# Patient Record
Sex: Female | Born: 1980 | Hispanic: No | Marital: Married | Smoking: Never smoker
Health system: Southern US, Community
[De-identification: ages and names within clinical notes are randomized; demographics above are authoritative.]

---

## 2008-12-10 ENCOUNTER — Ambulatory Visit: Payer: Self-pay | Admitting: Gynecology

## 2008-12-10 ENCOUNTER — Encounter: Payer: Self-pay | Admitting: Gynecology

## 2008-12-10 ENCOUNTER — Other Ambulatory Visit: Admission: RE | Admit: 2008-12-10 | Discharge: 2008-12-10 | Payer: Self-pay | Admitting: Gynecology

## 2008-12-13 ENCOUNTER — Encounter: Payer: Self-pay | Admitting: Gynecology

## 2008-12-13 ENCOUNTER — Ambulatory Visit: Payer: Self-pay | Admitting: Gynecology

## 2008-12-28 ENCOUNTER — Ambulatory Visit: Payer: Self-pay | Admitting: Gynecology

## 2015-05-11 ENCOUNTER — Other Ambulatory Visit: Payer: Self-pay | Admitting: Internal Medicine

## 2015-05-11 DIAGNOSIS — E041 Nontoxic single thyroid nodule: Secondary | ICD-10-CM

## 2015-05-25 ENCOUNTER — Other Ambulatory Visit: Payer: Self-pay | Admitting: Internal Medicine

## 2015-05-25 ENCOUNTER — Inpatient Hospital Stay
Admission: RE | Admit: 2015-05-25 | Discharge: 2015-05-25 | Disposition: A | Discharge status: Home / Self Care | Payer: Self-pay | Source: Ambulatory Visit | Attending: Internal Medicine | Admitting: Internal Medicine

## 2015-05-25 DIAGNOSIS — E041 Nontoxic single thyroid nodule: Secondary | ICD-10-CM

## 2016-10-12 ENCOUNTER — Encounter (HOSPITAL_COMMUNITY): Payer: Self-pay

## 2016-10-12 ENCOUNTER — Observation Stay (HOSPITAL_COMMUNITY)
Admission: EM | Admit: 2016-10-12 | Discharge: 2016-10-13 | Disposition: A | Discharge status: Home / Self Care | Payer: 59 | Attending: Internal Medicine | Admitting: Internal Medicine

## 2016-10-12 DIAGNOSIS — D649 Anemia, unspecified: Secondary | ICD-10-CM | POA: Diagnosis not present

## 2016-10-12 DIAGNOSIS — N920 Excessive and frequent menstruation with regular cycle: Secondary | ICD-10-CM | POA: Diagnosis not present

## 2016-10-12 DIAGNOSIS — R55 Syncope and collapse: Secondary | ICD-10-CM

## 2016-10-12 DIAGNOSIS — Z91018 Allergy to other foods: Secondary | ICD-10-CM | POA: Diagnosis not present

## 2016-10-12 DIAGNOSIS — D509 Iron deficiency anemia, unspecified: Secondary | ICD-10-CM | POA: Diagnosis not present

## 2016-10-12 LAB — BASIC METABOLIC PANEL WITH GFR
Anion gap: 9 (ref 5–15)
BUN: 15 mg/dL (ref 6–20)
CO2: 19 mmol/L — ABNORMAL LOW (ref 22–32)
Calcium: 8.4 mg/dL — ABNORMAL LOW (ref 8.9–10.3)
Chloride: 110 mmol/L (ref 101–111)
Creatinine, Ser: 0.91 mg/dL (ref 0.44–1.00)
GFR calc Af Amer: >60 mL/min (ref >60)
GFR calc non Af Amer: >60 mL/min (ref >60)
Glucose, Bld: 120 mg/dL — ABNORMAL HIGH (ref 65–99)
Potassium: 3.7 mmol/L (ref 3.5–5.1)
Sodium: 138 mmol/L (ref 135–145)

## 2016-10-12 LAB — CBC
HCT: 22.6 % — ABNORMAL LOW (ref 36.0–46.0)
Hemoglobin: 7.1 g/dL — ABNORMAL LOW (ref 12.0–15.0)
MCH: 17.8 pg — ABNORMAL LOW (ref 26.0–34.0)
MCHC: 31.4 g/dL (ref 30.0–36.0)
MCV: 56.5 fL — ABNORMAL LOW (ref 78.0–100.0)
Platelets: 174 K/uL (ref 150–400)
RBC: 4 MIL/uL (ref 3.87–5.11)
RDW: 19.3 % — ABNORMAL HIGH (ref 11.5–15.5)
WBC: 10.1 K/uL (ref 4.0–10.5)

## 2016-10-12 LAB — PREPARE RBC (CROSSMATCH)

## 2016-10-12 LAB — URINALYSIS, ROUTINE W REFLEX MICROSCOPIC
Bilirubin Urine: NEGATIVE
Glucose, UA: NEGATIVE mg/dL
Hgb urine dipstick: NEGATIVE
KETONES UR: 20 mg/dL — AB
LEUKOCYTES UA: NEGATIVE
NITRITE: NEGATIVE
PH: 5 (ref 5.0–8.0)
Protein, ur: NEGATIVE mg/dL
Specific Gravity, Urine: 1.016 (ref 1.005–1.030)

## 2016-10-12 LAB — POC OCCULT BLOOD, ED: FECAL OCCULT BLD: NEGATIVE

## 2016-10-12 LAB — CBG MONITORING, ED: Glucose-Capillary: 117 mg/dL — ABNORMAL HIGH (ref 65–99)

## 2016-10-12 MED ORDER — SODIUM CHLORIDE 0.9% FLUSH
3.0000 mL | Freq: Two times a day (BID) | INTRAVENOUS | Status: DC
  Administered 2016-10-13: 3 mL via INTRAVENOUS

## 2016-10-12 MED ORDER — SODIUM CHLORIDE 0.9% FLUSH: 3.0000 mL | INTRAVENOUS | Status: DC | PRN

## 2016-10-12 MED ORDER — SODIUM CHLORIDE 0.9 % IV BOLUS (SEPSIS)
1000.0000 mL | Freq: Once | INTRAVENOUS | Status: AC
  Administered 2016-10-12: 1000 mL via INTRAVENOUS

## 2016-10-12 MED ORDER — ONDANSETRON HCL 4 MG PO TABS: 4.0000 mg | ORAL_TABLET | Freq: Four times a day (QID) | ORAL | Status: DC | PRN

## 2016-10-12 MED ORDER — SODIUM CHLORIDE 0.9 % IV SOLN: 10.0000 mL/h | Freq: Once | INTRAVENOUS | Status: DC

## 2016-10-12 MED ORDER — ONDANSETRON HCL 4 MG/2ML IJ SOLN: 4.0000 mg | Freq: Four times a day (QID) | INTRAMUSCULAR | Status: DC | PRN

## 2016-10-12 MED ORDER — SODIUM CHLORIDE 0.9 % IV SOLN: 250.0000 mL | INTRAVENOUS | Status: DC | PRN

## 2016-10-12 MED ORDER — SENNOSIDES-DOCUSATE SODIUM 8.6-50 MG PO TABS: 1.0000 | ORAL_TABLET | Freq: Every evening | ORAL | Status: DC | PRN

## 2016-10-12 NOTE — ED Triage Notes (Signed)
Pt brought in By EMS with an episode of near syncope as per EMS pt was working out, pt had some light-headness. Per EMS 60/40. Pt was given 1 bolus Normal saline, and per EMS had episode of nausea and was given 4 mg of Zofran.

## 2016-10-12 NOTE — ED Provider Notes (Signed)
Emergency Department Provider Note   I have reviewed the triage vital signs and the nursing notes.   HISTORY  Chief Complaint Near Syncope and Dehydration   HPI Elizabeth Spears is a 36 y.o. female with PMH of iron deficiency anemia presents to the emergency department for evaluation of syncope after an exercise class. Patient states she was fine during the class but afterwards had a syncopal event. She denies any preceding chest pain, palpitations, difficult to breathing. She reports a remote history of iron deficiency anemia but has never required a blood transfusion. She denies any black or bright red blood in the stool. No recent illness. No vomiting or diarrhea. She regained consciousness after the exercise class and EMS was called. She notes some left-sided abdominal discomfort that began in route. No vaginal bleeding or discharge.    History reviewed. No pertinent past medical history.  Patient Active Problem List   Diagnosis Date Noted  . Anemia 10/12/2016  . Symptomatic anemia 10/12/2016  . Iron deficiency anemia 10/12/2016  . Syncope 10/12/2016  . Microcytic anemia 10/12/2016    History reviewed. No pertinent surgical history.    Allergies Orange fruit [citrus]  History reviewed. No pertinent family history.  Social History Social History  Substance Use Topics  . Smoking status: Never Smoker  . Smokeless tobacco: Never Used  . Alcohol use Yes     Comment: socially    Review of Systems  Constitutional: No fever/chills. Positive syncope.  Eyes: No visual changes. ENT: No sore throat. Cardiovascular: Denies chest pain. Respiratory: Denies shortness of breath. Gastrointestinal: No abdominal pain.  No nausea, no vomiting.  No diarrhea.  No constipation. Genitourinary: Negative for dysuria. Musculoskeletal: Negative for back pain. Skin: Negative for rash. Neurological: Negative for headaches, focal weakness or numbness.  10-point ROS otherwise  negative.  ____________________________________________   PHYSICAL EXAM:  VITAL SIGNS: ED Triage Vitals  Enc Vitals Group     BP 10/12/16 2112 92/60     Pulse Rate 10/12/16 2112 100     Resp 10/12/16 2115 17     Temp 10/12/16 2118 97.7 F (36.5 C)     Temp Source 10/12/16 2112 Oral     SpO2 10/12/16 2115 100 %     Weight 10/12/16 2135 180 lb (81.6 kg)     Height 10/12/16 2135 5\' 3"  (1.6 m)     Pain Score 10/12/16 2128 4   Constitutional: Alert and oriented. Well appearing and in no acute distress. Eyes: Conjunctivae are normal.  Head: Atraumatic. Nose: No congestion/rhinnorhea. Mouth/Throat: Mucous membranes are moist.  Neck: No stridor.   Cardiovascular: Normal rate, regular rhythm. Good peripheral circulation. Grossly normal heart sounds.   Respiratory: Normal respiratory effort.  No retractions. Lungs CTAB. Gastrointestinal: Soft and nontender. No distention. Brown stool on rectal exam. No melena or BRB. Musculoskeletal: No lower extremity tenderness nor edema. No gross deformities of extremities. Neurologic:  Normal speech and language. No gross focal neurologic deficits are appreciated.  Skin:  Skin is warm, dry and intact. No rash noted.  ____________________________________________   LABS (all labs ordered are listed, but only abnormal results are displayed)  Labs Reviewed  BASIC METABOLIC PANEL - Abnormal; Notable for the following:       Result Value   CO2 19 (*)    Glucose, Bld 120 (*)    Calcium 8.4 (*)    All other components within normal limits  CBC - Abnormal; Notable for the following:    Hemoglobin 7.1 (*)  HCT 22.6 (*)    MCV 56.5 (*)    MCH 17.8 (*)    RDW 19.3 (*)    All other components within normal limits  URINALYSIS, ROUTINE W REFLEX MICROSCOPIC - Abnormal; Notable for the following:    Ketones, ur 20 (*)    All other components within normal limits  BASIC METABOLIC PANEL - Abnormal; Notable for the following:    Glucose, Bld 109 (*)     Calcium 8.6 (*)    All other components within normal limits  CBC - Abnormal; Notable for the following:    Hemoglobin 7.8 (*)    HCT 24.2 (*)    MCV 59.6 (*)    MCH 19.2 (*)    RDW 22.0 (*)    All other components within normal limits  CBC WITH DIFFERENTIAL/PLATELET - Abnormal; Notable for the following:    Hemoglobin 9.3 (*)    HCT 28.6 (*)    MCV 61.1 (*)    MCH 19.9 (*)    RDW 23.5 (*)    All other components within normal limits  CBG MONITORING, ED - Abnormal; Notable for the following:    Glucose-Capillary 117 (*)    All other components within normal limits  IRON AND TIBC  FERRITIN  HEMOGLOBIN A1C  POC OCCULT BLOOD, ED  TYPE AND SCREEN  PREPARE RBC (CROSSMATCH)   ____________________________________________  EKG   EKG Interpretation  Date/Time:  Friday October 12 2016 21:18:38 EST Ventricular Rate:  94 PR Interval:    QRS Duration: 102 QT Interval:  361 QTC Calculation: 452 R Axis:   75 Text Interpretation:  Sinus rhythm.  No STEMI.  Confirmed by Treyvon Blahut MD, Vannah Nadal [phone]) on 10/12/2016 9:44:10 PM       ____________________________________________  RADIOLOGY  None ____________________________________________   PROCEDURES  Procedure(s) performed:   Procedures  CRITICAL CARE Performed by: Maia PlanJoshua G Fillmore Bynum Total critical care time: 30 minutes Critical care time was exclusive of separately billable procedures and treating other patients. Critical care was necessary to treat or prevent imminent or life-threatening deterioration. Critical care was time spent personally by me on the following activities: development of treatment plan with patient and/or surrogate as well as nursing, discussions with consultants, evaluation of patient's response to treatment, examination of patient, obtaining history from patient or surrogate, ordering and performing treatments and interventions, ordering and review of laboratory studies, ordering and review of radiographic  studies, pulse oximetry and re-evaluation of patient's condition.  2 units of PRBC ordered and IVF bolus given with persistent tachycardia, symptomatic anemia, and syncope PTA.   Alona BeneJoshua Favour Aleshire, MD Emergency Medicine  ____________________________________________   INITIAL IMPRESSION / ASSESSMENT AND PLAN / ED COURSE  Pertinent labs & imaging results that were available during my care of the patient were reviewed by me and considered in my medical decision making (see chart for details).  Patient resents to the emergency department for evaluation of syncope. She has normal sinus rhythm on EKG. Her hemoglobin is 7.1. Given her syncope and continued tachycardia in the emergency department would benefit from blood transfusion. No chest pain or dyspnea to make me concerned for pulmonary embolism. I discuss observational admission with the hospitalist for transfusion and further anemia/syncope w/u. Patient does not have a PCP.   Discussed patient's case with hospitalist, Dr. Julien NordmannLangeland.  Recommend admission to obs, med-surg bed.  I will place holding orders per their request. Patient and family (if present) updated with plan. Care transferred to hospitalist service.  I reviewed  all nursing notes, vitals, pertinent old records, EKGs, labs, imaging (as available).   ____________________________________________  FINAL CLINICAL IMPRESSION(S) / ED DIAGNOSES  Final diagnoses:  Syncope, unspecified syncope type  Anemia, unspecified type     MEDICATIONS GIVEN DURING THIS VISIT:  Medications  0.9 %  sodium chloride infusion (not administered)  senna-docusate (Senokot-S) tablet 1 tablet (not administered)  ondansetron (ZOFRAN) tablet 4 mg (not administered)    Or  ondansetron (ZOFRAN) injection 4 mg (not administered)  sodium chloride flush (NS) 0.9 % injection 3 mL (not administered)  sodium chloride flush (NS) 0.9 % injection 3 mL (not administered)  0.9 %  sodium chloride infusion (not  administered)  ferrous sulfate tablet 325 mg (not administered)  sodium chloride 0.9 % bolus 1,000 mL (0 mLs Intravenous Stopped 10/12/16 2329)     NEW OUTPATIENT MEDICATIONS STARTED DURING THIS VISIT:  Current Discharge Medication List    START taking these medications   Details  ferrous sulfate 325 (65 FE) MG tablet Take 1 tablet (325 mg total) by mouth 2 (two) times daily with a meal. Qty: 60 tablet, Refills: 3         Note:  This document was prepared using Dragon voice recognition software and may include unintentional dictation errors.  Alona Bene, MD Emergency Medicine   Maia Plan, MD 10/13/16 [phone]

## 2016-10-12 NOTE — H&P (Signed)
[facility] History and Physical  Elizabeth StaplerBrandi Alcala RUE:[id number] DOB: 05/13/1981 DOA: 10/12/2016  Referring physician: ED PCP: No primary care provider on file.   Has pcp but has not seen for long time  Chief Complaint: syncope  HPI: Elizabeth Spears is a 36 y.o. female with  history of mild iron deficiency anemia, for which she used to take iron pills but has not in a long time presents to the ED status post syncopal event.  She was in her Zumba class, when they were doing floor exercises, she became lightheaded, diaphoretic, and apparently passed out.  She does not recall much prior.  She quickly regained consciousness and EMS called. She denied any preceding sob/doe/cp/palpitations/n/v/f/c/d/vertigo. She takes the  class two times a week with no problems.  Denies currently bleeding.  States her menses typically last about 5 days, and typically heavy the first 2-3 days.    Eats a lot of ice, but that is normal for her she states.  Her husband is here w/ her.  Review of Systems:  Per hpi, o/w all systems reviewed and negative.  History reviewed. No pertinent past medical history. History reviewed. No pertinent surgical history. Social History:  reports that she has never smoked. She has never used smokeless tobacco. She reports that she drinks alcohol. She reports that she does not use drugs. Has 3 grown children, abt 12, 16 and 18  Allergies  Allergen Reactions  . Orange Fruit [Citrus] Hives    History reviewed. No pertinent family history.   Prior to Admission medications   Not on File   Physical Exam: Vitals:   10/12/16 2112 10/12/16 2115 10/12/16 2118 10/12/16 2135  BP: 92/60 100/77    Pulse: 100 84    Resp:  17    Temp:   97.7 F (36.5 C)   TempSrc: Oral  Oral   SpO2:  100%    Weight:    81.6 kg (180 lb)  Height:    5\' 3"  (1.6 m)    Wt Readings from Last 3 Encounters:  10/12/16 81.6 kg (180 lb)    General:  Appears calm and comfortable, pleasant, NAD, AAOx3,  pleasant AAF. Eyes: PERRL, normal lids, irises & conjunctiva ENT: grossly normal hearing, lips & tongue, mmm Neck: no LAD, no jvp Cardiovascular: RRR, no m/r/g. No LE edema. Telemetry: SR, no arrhythmias  Respiratory: CTA bilaterally, no w/r/r. Normal respiratory effort. Abdomen: soft, ntnd, obese. Skin: no rash or induration seen on limited exam Musculoskeletal: grossly normal tone BUE/BLE Psychiatric: grossly normal mood and affect, speech fluent and appropriate Neurologic: grossly non-focal.          Labs on Admission:  Basic Metabolic Panel:  Recent Labs Lab 10/12/16 2122  NA 138  K 3.7  CL 110  CO2 19*  GLUCOSE 120*  BUN 15  CREATININE 0.91  CALCIUM 8.4*   Liver Function Tests: No results for input(s): AST, ALT, ALKPHOS, BILITOT, PROT, ALBUMIN in the last 168 hours. No results for input(s): LIPASE, AMYLASE in the last 168 hours. No results for input(s): AMMONIA in the last 168 hours. CBC:  Recent Labs Lab 10/12/16 2122  WBC 10.1  HGB 7.1*  HCT 22.6*  MCV 56.5*  PLT 174   Cardiac Enzymes: No results for input(s): CKTOTAL, CKMB, CKMBINDEX, TROPONINI in the last 168 hours.  BNP (last 3 results) No results for input(s): BNP in the last 8760 hours.  ProBNP (last 3 results) No results for input(s): PROBNP in the last 8760 hours.  CBG:  Recent Labs Lab 10/12/16 2127  GLUCAP 117*    Radiological Exams on Admission: No results found.  EKG: Independently reviewed.   EKG Interpretation  Date/Time:  Friday October 12 2016 21:18:38 EST Ventricular Rate:  94 PR Interval:    QRS Duration: 102 QT Interval:  361 QTC Calculation: 452 R Axis:   75 Text Interpretation:  Sinus rhythm.  No STEMI.  Confirmed by LONG MD, JOSHUA [phone]) on 10/12/2016 9:44:10 PM        Assessment/Plan Principal Problem:   Symptomatic anemia Active Problems:   Anemia   Iron deficiency anemia   1. Symptomatically anemia, likely iron deficiency anemia secondary to heavy  menses with the syncopal event. - obs med surg - iron studies added to labs drawn in Ed - er ordered 2 units of prbc - recd pt calls her pcp for close f/u on dc as well  2. Iron def anemia, in past, and suspect currently, with severe microcytic anemia - will need po iron on discharge  3. Syncope - see #1, doubt cardiac event., no cp/sob/doe/palipitation during exercise class.  No c/s  Code Status:  full DVT Prophylaxis:  Scds,  Family Communication: pt and husband at bedside Disposition Plan: obs med surg  Time spent:  Pete Glatter MD., MBA/MHA [facility] Pager [phone]

## 2016-10-12 NOTE — ED Notes (Signed)
PT is alert and oriented x 4 and is having generalized weakness, and low tone speech. Pt does report having lightheadedness with a nears syncope episode, no LOC,Pt does report being in a Zumba class and does report she was drinking water during exercise. Pt had one episode of emesis, denis any vis changes. PERRLA

## 2016-10-12 NOTE — ED Notes (Signed)
Bed: [id number] Expected date:  Expected time:  Means of arrival:  Comments: 36 yo F  Syncopal episode, hypotensive

## 2016-10-13 ENCOUNTER — Observation Stay (HOSPITAL_BASED_OUTPATIENT_CLINIC_OR_DEPARTMENT_OTHER): Payer: 59

## 2016-10-13 DIAGNOSIS — R55 Syncope and collapse: Secondary | ICD-10-CM

## 2016-10-13 DIAGNOSIS — D509 Iron deficiency anemia, unspecified: Principal | ICD-10-CM

## 2016-10-13 DIAGNOSIS — D649 Anemia, unspecified: Secondary | ICD-10-CM

## 2016-10-13 DIAGNOSIS — D500 Iron deficiency anemia secondary to blood loss (chronic): Secondary | ICD-10-CM | POA: Diagnosis not present

## 2016-10-13 LAB — CBC WITH DIFFERENTIAL/PLATELET
BASOS PCT: 0 %
Basophils Absolute: 0 10*3/uL (ref 0.0–0.1)
EOS ABS: 0 10*3/uL (ref 0.0–0.7)
Eosinophils Relative: 0 %
HEMATOCRIT: 28.6 % — AB (ref 36.0–46.0)
Hemoglobin: 9.3 g/dL — ABNORMAL LOW (ref 12.0–15.0)
LYMPHS ABS: 2.3 10*3/uL (ref 0.7–4.0)
Lymphocytes Relative: 29 %
MCH: 19.9 pg — AB (ref 26.0–34.0)
MCHC: 32.5 g/dL (ref 30.0–36.0)
MCV: 61.1 fL — AB (ref 78.0–100.0)
MONO ABS: 0.6 10*3/uL (ref 0.1–1.0)
Monocytes Relative: 8 %
NEUTROS ABS: 5.1 10*3/uL (ref 1.7–7.7)
NEUTROS PCT: 63 %
PLATELETS: 182 10*3/uL (ref 150–400)
RBC: 4.68 MIL/uL (ref 3.87–5.11)
RDW: 23.5 % — ABNORMAL HIGH (ref 11.5–15.5)
WBC: 8 10*3/uL (ref 4.0–10.5)

## 2016-10-13 LAB — ECHOCARDIOGRAM COMPLETE
HEIGHTINCHES: 65 in
WEIGHTICAEL: 2880 [oz_av]

## 2016-10-13 LAB — BASIC METABOLIC PANEL WITH GFR
Anion gap: 7 (ref 5–15)
BUN: 11 mg/dL (ref 6–20)
CO2: 22 mmol/L (ref 22–32)
Calcium: 8.6 mg/dL — ABNORMAL LOW (ref 8.9–10.3)
Chloride: 111 mmol/L (ref 101–111)
Creatinine, Ser: 0.73 mg/dL (ref 0.44–1.00)
GFR calc Af Amer: >60 mL/min
GFR calc non Af Amer: >60 mL/min
Glucose, Bld: 109 mg/dL — ABNORMAL HIGH (ref 65–99)
Potassium: 4.1 mmol/L (ref 3.5–5.1)
Sodium: 140 mmol/L (ref 135–145)

## 2016-10-13 LAB — CBC
HCT: 24.2 % — ABNORMAL LOW (ref 36.0–46.0)
Hemoglobin: 7.8 g/dL — ABNORMAL LOW (ref 12.0–15.0)
MCH: 19.2 pg — ABNORMAL LOW (ref 26.0–34.0)
MCHC: 32.2 g/dL (ref 30.0–36.0)
MCV: 59.6 fL — ABNORMAL LOW (ref 78.0–100.0)
Platelets: 159 K/uL (ref 150–400)
RBC: 4.06 MIL/uL (ref 3.87–5.11)
RDW: 22 % — ABNORMAL HIGH (ref 11.5–15.5)
WBC: 9.4 K/uL (ref 4.0–10.5)

## 2016-10-13 MED ORDER — FERROUS SULFATE 325 (65 FE) MG PO TABS
325.0000 mg | ORAL_TABLET | Freq: Two times a day (BID) | ORAL | Status: DC
  Administered 2016-10-13: 325 mg via ORAL
  Filled 2016-10-13: qty 1

## 2016-10-13 MED ORDER — FERROUS SULFATE 325 (65 FE) MG PO TABS: 325.0000 mg | ORAL_TABLET | Freq: Two times a day (BID) | ORAL | 3 refills | Status: AC

## 2016-10-13 NOTE — Progress Notes (Signed)
Pt leaving this afternoon with her husband. Alert, oriented, and without c/o. Discharge instructions/prescriptions given/explained with pt verbalizing understanding.  Urgency of followup appointment with MD noted for lab work.

## 2016-10-13 NOTE — Progress Notes (Signed)
Echocardiogram 2D Echocardiogram has been performed.  [name] 10/13/2016, 12:04 PM

## 2016-10-13 NOTE — Discharge Summary (Signed)
Physician Discharge Summary  Caedyn Tassinari WUJ:[id number] DOB: 10-27-1980 DOA: 10/12/2016  PCP: No primary care provider on file.  Admit date: 10/12/2016 Discharge date: 10/13/2016  Admitted From: Home Disposition:  Home  Recommendations for Outpatient Follow-up:  1. Establish with and Follow up with PCP in 1-2 weeks 2. Follow up with OB-Gyn Dr. Seymour Bars as an outpatient and call to schedule appt 3. Please obtain BMP/CBC in one week  Home Health: No Equipment/Devices: None  Discharge Condition: Stable CODE STATUS: FULL CODE Diet recommendation: Regular  Brief/Interim Summary: Elizabeth Spears is a 36 y.o. female with a history of mild iron deficiency anemia, for which she used to take iron pills but has not in a long time presents to the ED status post syncopal event. She was in her Zumba class, when they were doing floor exercises, she became lightheaded, diaphoretic, and apparently passed out after exercises were done. She does not recall much prior.  She quickly regained consciousness and EMS called. She denied any preceding sob/doe/cp/palpitations/n/v/f/c/d/vertigo. She takes the class two times a week with no problems.  Denies currently bleeding. States her menses typically last about 5 days, and typically heavy the first 2-3 days and FDLMP was Dec 17. Was admitted for Syncope and worked up.   Discharge Diagnoses:  Principal Problem:   Symptomatic anemia Active Problems:   Anemia   Iron deficiency anemia   Syncope   Microcytic anemia  Symptomatically anemia, likely iron deficiency anemia secondary to heavy menses with the syncopal event. - Iron studies added to labs drawn in Ed however unable to be done; Will need them as an Outpatient as patient received 2 units of pRBC -S/p 2 units of pRBC's; Hb/Hct went from 7.1/22.6 -> 9.3/28.6 -Will need to establish with PCP and follow up at D/C -Follow up with Dr. Seymour Bars in Ob/Gyn for Menorrhagia  Iron def anemia, in past, and suspect  currently, with severe microcytic anemia -Will need Iron Studies as outpatient -Ferrous Sulfate 325 mg BIDwm written at discharge  Syncope likely from hypovolemia -Patient was nauseous and vomitted 2x after Exercises -S/p Fluid Bolus and Transfusion of 2 units of pRBC -See #1, doubt cardiac event., no cp/sob/doe/palipitation during exercise class. -Echocardiogram Normal -Orthostatics Normal -Continue to Monitor Closely and follow up and establish with PCP and follow up with OB/GYN  Discharge Instructions  Discharge Instructions    Call MD for:  difficulty breathing, headache or visual disturbances    Complete by:  As directed    Call MD for:  extreme fatigue    Complete by:  As directed    Call MD for:  persistant dizziness or light-headedness    Complete by:  As directed    Call MD for:  persistant nausea and vomiting    Complete by:  As directed    Call MD for:  temperature >100.4    Complete by:  As directed    Diet - low sodium heart healthy    Complete by:  As directed    Discharge instructions    Complete by:  As directed    Follow up with PCP and OB/GYN as a outpatient. Take all medications as prescribed. Please have outpatient Iron studies done at PCP office. If symptoms worsen or change please return to the ED for evaluation.   Increase activity slowly    Complete by:  As directed      Allergies as of 10/13/2016      Reactions   Orange Fruit [citrus] Hives  Medication List    TAKE these medications   ferrous sulfate 325 (65 FE) MG tablet Take 1 tablet (325 mg total) by mouth 2 (two) times daily with a meal.       Allergies  Allergen Reactions  . Orange Fruit [Citrus] Hives    Consultations:  None  Procedures/Studies:  No results found.    ECHOCARDIOGRAM Study Conclusions  - Left ventricle: The cavity size was normal. Systolic function was   normal. The estimated ejection fraction was in the range of 60%   to 65%. Wall motion was normal;  there were no regional wall   motion abnormalities.  Subjective: Seen and examined at bedside and felt a lot better after receiving blood. No N/V/Lightheadedness or Dizziness. No reoccurrence of Syncope.   Discharge Exam: Vitals:   10/13/16 1031 10/13/16 1354  BP: 98/68 103/73  Pulse: 65 68  Resp: 20 18  Temp: 99.4 F (37.4 C) 98.9 F (37.2 C)   Vitals:   10/13/16 0633 10/13/16 0840 10/13/16 1031 10/13/16 1354  BP: 110/70 103/70 98/68 103/73  Pulse: 71 71 65 68  Resp: 18 18 20 18   Temp: 99.3 F (37.4 C) 99.1 F (37.3 C) 99.4 F (37.4 C) 98.9 F (37.2 C)  TempSrc: Oral Oral Oral Oral  SpO2: 100% 100% 100% 100%  Weight:      Height:       General: Pt is alert, awake, not in acute distress Cardiovascular: RRR, S1/S2 +, no rubs, no gallops Respiratory: CTA bilaterally, no wheezing, no rhonchi Abdominal: Soft, NT, ND, bowel sounds + Extremities: no edema, no cyanosis  The results of significant diagnostics from this hospitalization (including imaging, microbiology, ancillary and laboratory) are listed below for reference.    Microbiology: No results found for this or any previous visit (from the past 240 hour(s)).   Labs: BNP (last 3 results) No results for input(s): BNP in the last 8760 hours. Basic Metabolic Panel:  Recent Labs Lab 10/12/16 2122 10/13/16 0610  NA 138 140  K 3.7 4.1  CL 110 111  CO2 19* 22  GLUCOSE 120* 109*  BUN 15 11  CREATININE 0.91 0.73  CALCIUM 8.4* 8.6*   Liver Function Tests: No results for input(s): AST, ALT, ALKPHOS, BILITOT, PROT, ALBUMIN in the last 168 hours. No results for input(s): LIPASE, AMYLASE in the last 168 hours. No results for input(s): AMMONIA in the last 168 hours. CBC:  Recent Labs Lab 10/12/16 2122 10/13/16 0610 10/13/16 1230  WBC 10.1 9.4 8.0  NEUTROABS  --   --  5.1  HGB 7.1* 7.8* 9.3*  HCT 22.6* 24.2* 28.6*  MCV 56.5* 59.6* 61.1*  PLT 174 159 182   Cardiac Enzymes: No results for input(s): CKTOTAL,  CKMB, CKMBINDEX, TROPONINI in the last 168 hours. BNP: Invalid input(s): POCBNP CBG:  Recent Labs Lab 10/12/16 2127  GLUCAP 117*   D-Dimer No results for input(s): DDIMER in the last 72 hours. Hgb A1c No results for input(s): HGBA1C in the last 72 hours. Lipid Profile No results for input(s): CHOL, HDL, LDLCALC, TRIG, CHOLHDL, LDLDIRECT in the last 72 hours. Thyroid function studies No results for input(s): TSH, T4TOTAL, T3FREE, THYROIDAB in the last 72 hours.  Invalid input(s): FREET3 Anemia work up No results for input(s): VITAMINB12, FOLATE, FERRITIN, TIBC, IRON, RETICCTPCT in the last 72 hours. Urinalysis    Component Value Date/Time   COLORURINE YELLOW 10/12/2016 2120   APPEARANCEUR CLEAR 10/12/2016 2120   LABSPEC 1.016 10/12/2016 2120   PHURINE  5.0 10/12/2016 2120   GLUCOSEU NEGATIVE 10/12/2016 2120   HGBUR NEGATIVE 10/12/2016 2120   BILIRUBINUR NEGATIVE 10/12/2016 2120   KETONESUR 20 (A) 10/12/2016 2120   PROTEINUR NEGATIVE 10/12/2016 2120   NITRITE NEGATIVE 10/12/2016 2120   LEUKOCYTESUR NEGATIVE 10/12/2016 2120   Sepsis Labs Invalid input(s): PROCALCITONIN,  WBC,  LACTICIDVEN Microbiology No results found for this or any previous visit (from the past 240 hour(s)).  Time coordinating discharge: Over 30 minutes  SIGNED:  Merlene Laughter, DO [facility] 10/13/2016, 2:10 PM Pager [phone]  If 7PM-7AM, please contact night-coverage [email or web address] Password [email or web address]

## 2016-10-13 NOTE — Care Management Note (Signed)
Case Management Note  Patient Details  Name: Elizabeth Spears MRN: [id number] Date of Birth: 08/23/1981  Subjective/Objective:   Iron deficiency anemia                 Action/Plan: Discharge Planning: AVS reviewed:  NCM spoke to pt and states [facility] Also [facility] to [facility]. Pt [facility] Monday [facility]      Expected Discharge Date:  10/14/2015               Expected Discharge Plan:  Home/Self Care  In-House Referral:  NA  Discharge planning Services  CM Consult  Post Acute Care Choice:  NA Choice offered to:  NA  DME Arranged:  N/A DME Agency:  NA  HH Arranged:  NA HH Agency:  NA  Status of Service:  Completed, signed off  If discussed at Long Length of Stay Meetings, dates discussed:    Additional Comments:  Elliot CousinShavis, Lindie Roberson Ellen, RN 10/13/2016, 2:30 PM

## 2016-10-15 LAB — TYPE AND SCREEN
BLOOD PRODUCT EXPIRATION DATE: 201801112359
Blood Product Expiration Date: 201801122359
ISSUE DATE / TIME: 201801060247
ISSUE DATE / TIME: 201801060609
UNIT TYPE AND RH: 6200
Unit Type and Rh: 6200

## 2016-10-15 LAB — HEMOGLOBIN A1C
Hgb A1c MFr Bld: 5.1 % (ref 4.8–5.6)
Mean Plasma Glucose: 100 mg/dL

## 2018-12-02 ENCOUNTER — Other Ambulatory Visit: Payer: Self-pay | Admitting: Obstetrics and Gynecology

## 2018-12-02 DIAGNOSIS — E049 Nontoxic goiter, unspecified: Secondary | ICD-10-CM

## 2018-12-03 ENCOUNTER — Other Ambulatory Visit: Payer: 59

## 2018-12-16 ENCOUNTER — Ambulatory Visit
Admission: RE | Admit: 2018-12-16 | Discharge: 2018-12-16 | Disposition: A | Discharge status: Home / Self Care | Payer: 59 | Source: Ambulatory Visit | Attending: Obstetrics and Gynecology | Admitting: Obstetrics and Gynecology

## 2018-12-16 DIAGNOSIS — E049 Nontoxic goiter, unspecified: Secondary | ICD-10-CM

## 2019-12-31 ENCOUNTER — Ambulatory Visit: Payer: BC Managed Care – PPO | Attending: Family

## 2019-12-31 DIAGNOSIS — Z23 Encounter for immunization: Secondary | ICD-10-CM

## 2019-12-31 NOTE — Progress Notes (Signed)
   Covid-19 Vaccination Clinic  Name:  Elizabeth Spears    MRN: [id number] DOB: 1980/11/12  12/31/2019  [name] was observed post Covid-19 immunization for 15 minutes without incident. She was provided with Vaccine Information Sheet and instruction to access the V-Safe system.   [name] was instructed to call 911 with any severe reactions post vaccine: Marland Kitchen Difficulty breathing  . Swelling of face and throat  . A fast heartbeat  . A bad rash all over body  . Dizziness and weakness   Immunizations Administered    Name Date Dose VIS Date Route   Moderna COVID-19 Vaccine 12/31/2019 11:50 AM 0.5 mL 09/08/2019 Intramuscular   Manufacturer: Moderna   Lot: [id number]   NDC: 54270-623-76

## 2020-01-26 IMAGING — US US THYROID
1 series · 16 of 25 positions shown · non-contrast
Comparison: 04/14/2015

CLINICAL DATA: Thyromegaly

EXAM:
THYROID ULTRASOUND
TECHNIQUE: Ultrasound examination of the thyroid gland and adjacent soft
tissues was performed.

[Series 1: us thyroid · 0.08mm/px · 16 of 62 slices shown]
[im 1/62]
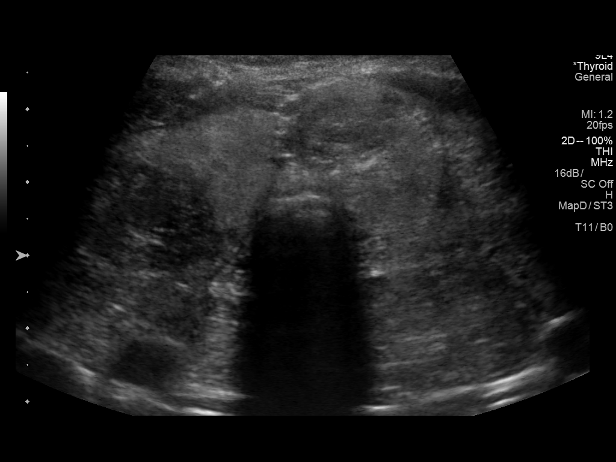
[im 6/62]
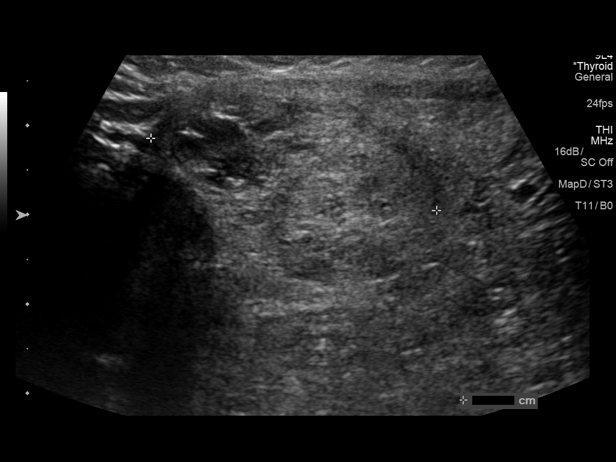
[im 8/62]
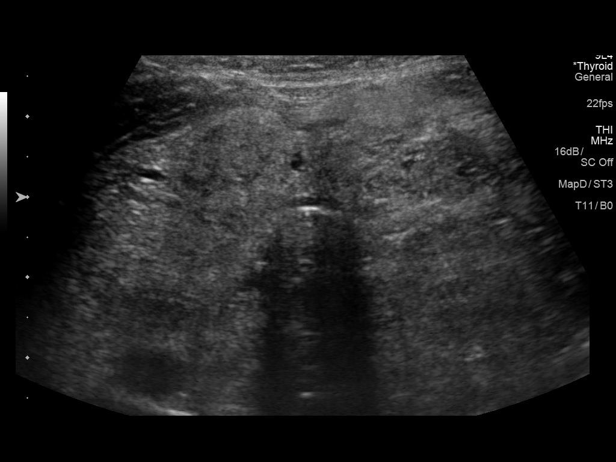
[im 13/62]
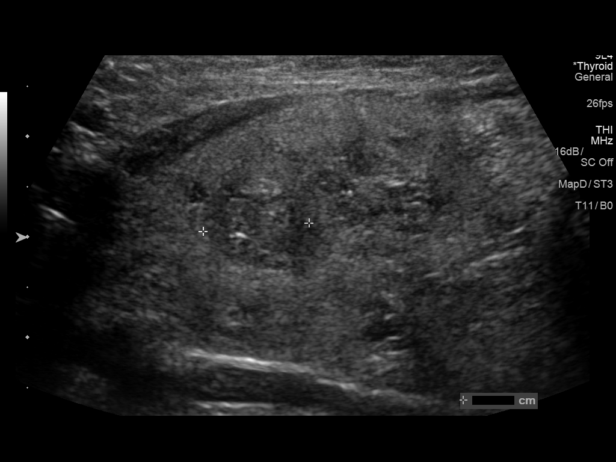
[im 18/62]
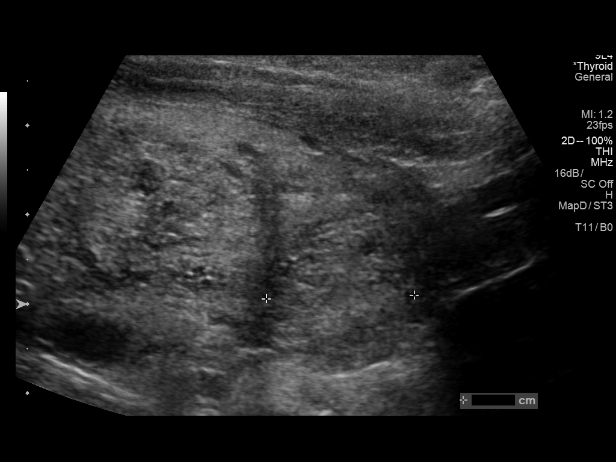
[im 21/62]
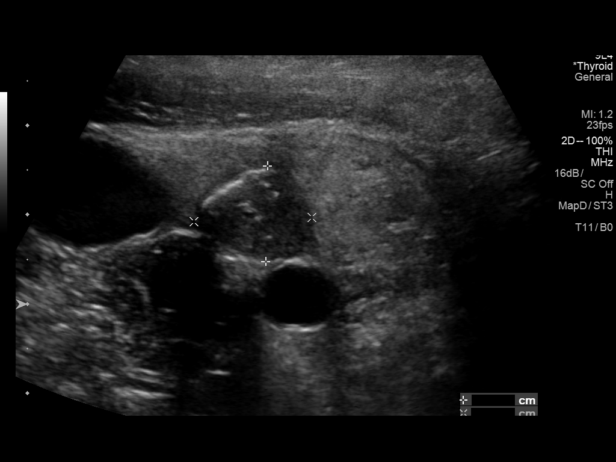
[im 26/62]
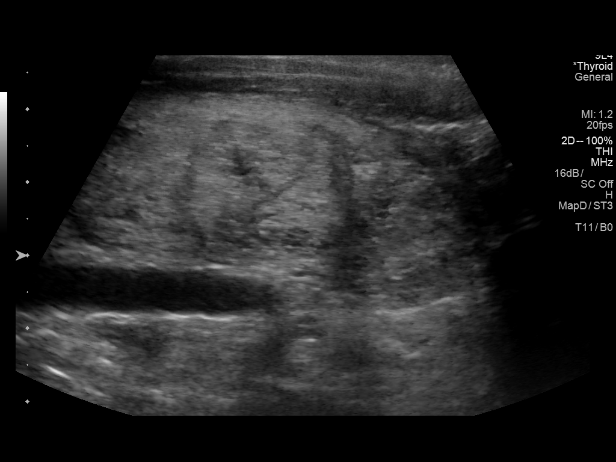
[im 28/62]
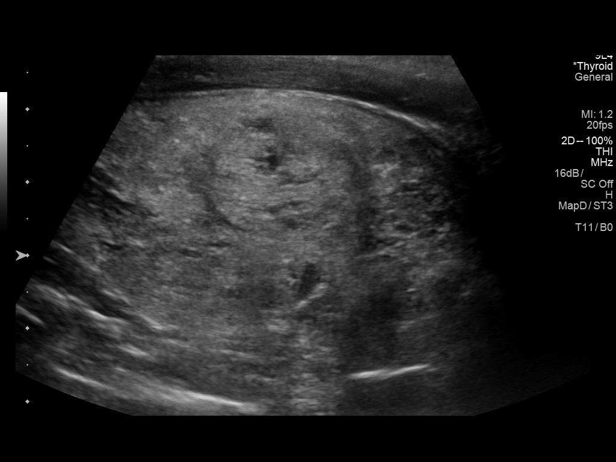
[im 34/62]
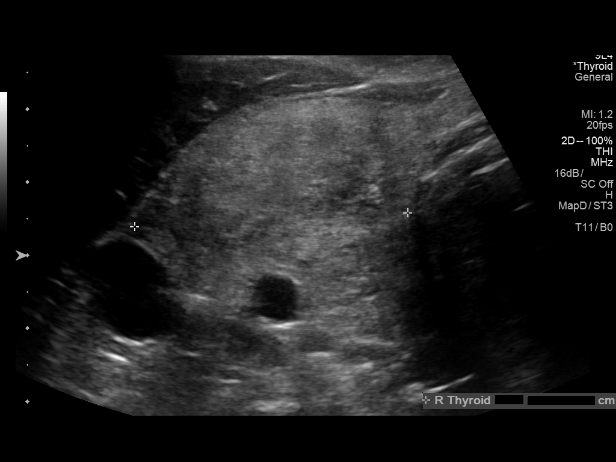
[im 36/62]
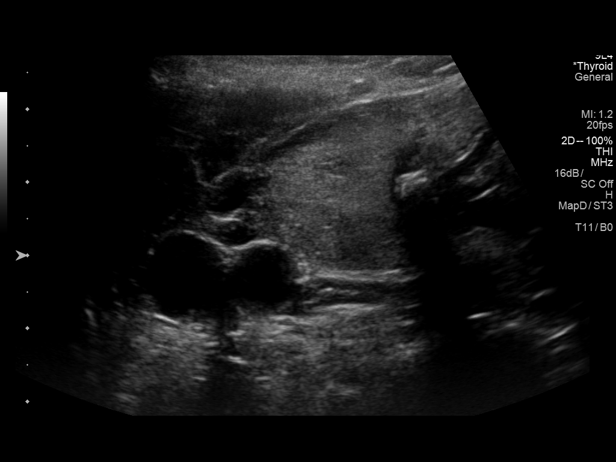
[im 41/62]
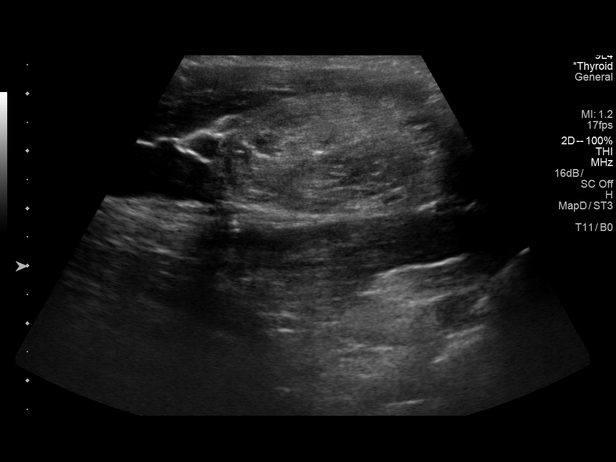
[im 44/62]
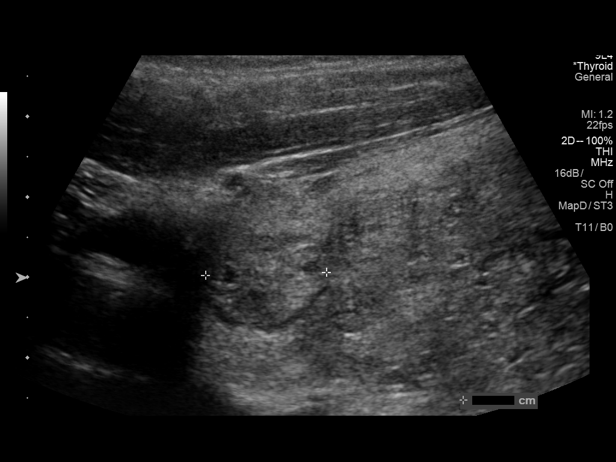
[im 49/62]
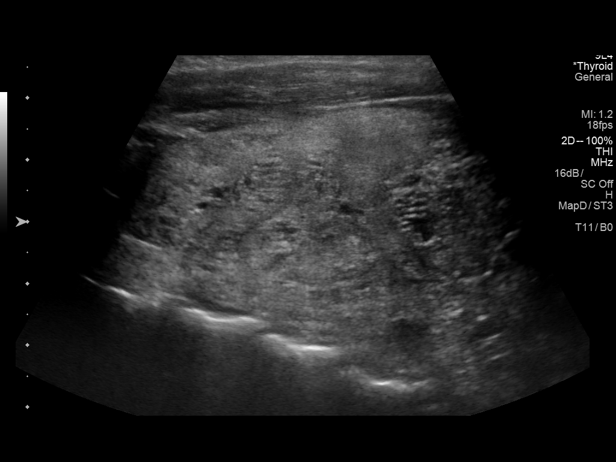
[im 54/62]
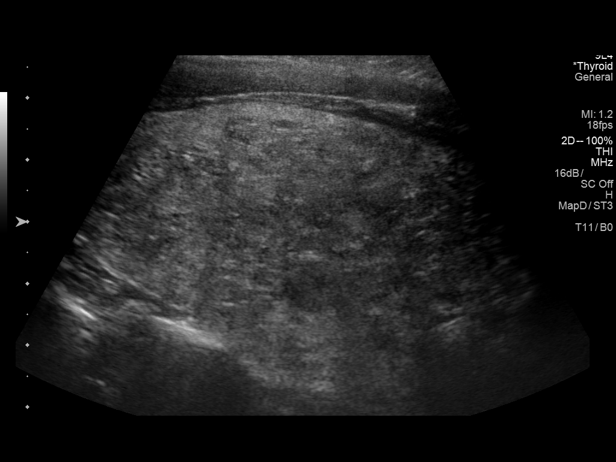
[im 56/62]
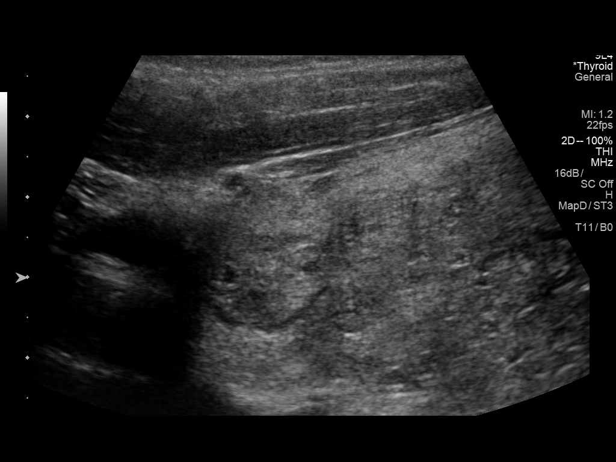
[im 62/62]
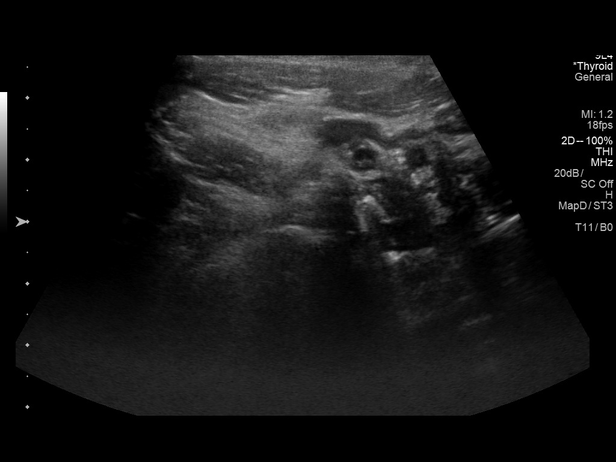

[16 of 25 positions shown; findings below may reference images not displayed]

FINDINGS: Parenchymal Echotexture: Markedly heterogenous

Isthmus: 1 cm thickness

Right lobe: 8.1 x 3.5 x 3.7 cm, previously 11.2 x 3.2 x

Left lobe: 8.6 x 4.5 x 3.9 cm, previously 11.7 x 3.6 x

_________________________________________________________

Estimated total number of nodules >/= 1 cm: 6-10

Number of spongiform nodules >/=  2 cm not described below (TR1): 0

Number of mixed cystic and solid nodules >/= 1.5 cm not described
below (TR2): 0

_________________________________________________________

Nodule # 1:

Prior biopsy: No

Location: Isthmus; right of midline

Maximum size: 2.4 cm; Other 2 dimensions: 1.6 x 1.3 cm, previously,
1.4 x 0.8 x 1 cm

Composition: solid/almost completely solid (2)

Echogenicity: isoechoic (1)

Shape: not taller-than-wide (0)

Margins: ill-defined (0)

Echogenic foci: none (0)

ACR TI-RADS total points: 3.

ACR TI-RADS risk category:  TR3 (3 points).

ACR TI-RADS recommendations:

*Given size (>/= 1.5 - 2.4 cm) and appearance, a follow-up
ultrasound in 1 year should be considered based on TI-RADS criteria.

_________________________________________________________

Nodule # 2:

Location: Isthmus; left of midline

Maximum size: 3.3 cm; Other 2 dimensions: 2.4 x 2.3 cm

Composition: solid/almost completely solid (2)

Echogenicity: isoechoic (1)

Shape: not taller-than-wide (0)

Margins: ill-defined (0)

Echogenic foci:

ACR TI-RADS total points: 3.

ACR TI-RADS risk category: TR3 (3 points).

ACR TI-RADS recommendations:

**Given size (>/= 2.5 cm) and appearance, fine needle aspiration of
this mildly suspicious nodule should be considered based on TI-RADS
criteria.

_________________________________________________________

Nodule # 3:

Location: Right; Superior

Maximum size: 1.1 cm; Other 2 dimensions: 1 x 0.9 cm

Composition: solid/almost completely solid (2)

Echogenicity: hypoechoic (2)

Shape: not taller-than-wide (0)

Margins: ill-defined (0)

Echogenic foci: macrocalcifications (1)

ACR TI-RADS total points: 5.

ACR TI-RADS risk category: TR4 (4-6 points).

ACR TI-RADS recommendations:

*Given size (>/= 1 - 1.4 cm) and appearance, a follow-up ultrasound
in 1 year should be considered based on TI-RADS criteria.

_________________________________________________________

Nodule # 4:

Location: Right; Mid

Maximum size: 4.4 cm; Other 2 dimensions: 2.6 x 1.9 cm

Composition: solid/almost completely solid (2)

Echogenicity: isoechoic (1)

Shape: not taller-than-wide (0)

Margins: ill-defined (0)

Echogenic foci: none (0)

ACR TI-RADS total points: 3.

ACR TI-RADS risk category: TR3 (3 points).

ACR TI-RADS recommendations:

**Given size (>/= 2.5 cm) and appearance, fine needle aspiration of
this mildly suspicious nodule should be considered based on TI-RADS
criteria.

_________________________________________________________

Nodule # 5:

Location: Right; Inferior medial

Maximum size: 1.7 cm; Other 2 dimensions: 1.7 x 1.6 cm

Composition: solid/almost completely solid (2)

Echogenicity: isoechoic (1)

Shape: taller-than-wide (3)

Margins: ill-defined (0)

Echogenic foci: none (0)

ACR TI-RADS total points: 6.

ACR TI-RADS risk category: TR4 (4-6 points).

ACR TI-RADS recommendations:

**Given size (>/= 1.5 cm) and appearance, fine needle aspiration of
this moderately suspicious nodule should be considered based on
TI-RADS criteria.

_________________________________________________________

Nodule # 6:

Location: Right; Inferior lateral

Maximum size: 1.7 cm; Other 2 dimensions: 1.3 x 1.1 cm

Composition: solid/almost completely solid (2)

Echogenicity: hypoechoic (2)

Shape: not taller-than-wide (0)

Margins: ill-defined (0)

Echogenic foci: peripheral calcifications (2)

ACR TI-RADS total points: 6.

ACR TI-RADS risk category: TR4 (4-6 points).

ACR TI-RADS recommendations:

**Given size (>/= 1.5 cm) and appearance, fine needle aspiration of
this moderately suspicious nodule should be considered based on
TI-RADS criteria.

_________________________________________________________

Nodule # 7:

Location: Left; Superior

Maximum size: 1.5 cm; Other 2 dimensions: 1.5 x 1.3 cm

Composition: solid/almost completely solid (2)

Echogenicity: isoechoic (1)

Shape: not taller-than-wide (0)

Margins: ill-defined (0)

Echogenic foci: none (0)

ACR TI-RADS total points: 3.

ACR TI-RADS risk category: TR3 (3 points).

ACR TI-RADS recommendations:

*Given size (>/= 1.5 - 2.4 cm) and appearance, a follow-up
ultrasound in 1 year should be considered based on TI-RADS criteria.
IMPRESSION: 1. Thyromegaly with bilateral nodules.
2. Recommend FNA biopsy of 1.7 cm moderately suspicious
inferolateral right nodule AND 1.7 cm moderately suspicious
inferomedial right nodule.
3. Recommend annual/biennial ultrasound follow-up of additional
nodules as above, until stability x5 years confirmed.

The above is in keeping with the ACR TI-RADS recommendations - [HOSPITAL] 3652;[DATE].

## 2020-02-02 ENCOUNTER — Ambulatory Visit: Payer: BC Managed Care – PPO | Attending: Family

## 2020-02-02 DIAGNOSIS — Z23 Encounter for immunization: Secondary | ICD-10-CM

## 2020-02-02 NOTE — Progress Notes (Signed)
   Covid-[facility] Vaccination [facility]  Name:  Elizabeth Spears    MRN: [id number] DOB: [birth year]  02/02/2020  [name] was observed post Covid-19 immunization for 15 minutes without incident. She was provided with Vaccine Information Sheet and instruction to access the V-Safe system.   [name] was instructed to call [phone] with any severe reactions post vaccine: Marland Kitchen Difficulty breathing  . Swelling of face and throat  . A fast heartbeat  . A bad rash all over body  . Dizziness and weakness   Immunizations Administered    Name Date Dose VIS Date Route   Moderna COVID-19 Vaccine 02/02/2020 10:55 AM 0.5 mL 09/2019 Intramuscular   Manufacturer: Moderna   Lot: [id number]   NDC: 84166-063-01
